# Patient Record
Sex: Male | Born: 1995 | Race: White | Hispanic: No | Marital: Single | Smoking: Former smoker
Health system: Southern US, Community
[De-identification: ages and names within clinical notes are randomized; demographics above are authoritative.]

---
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
## 2016-10-05 ENCOUNTER — Encounter: Payer: Self-pay | Admitting: Emergency Medicine

## 2016-10-05 ENCOUNTER — Ambulatory Visit
Admission: EM | Admit: 2016-10-05 | Discharge: 2016-10-05 | Disposition: A | Discharge status: Home / Self Care | Payer: 59 | Attending: Emergency Medicine | Admitting: Emergency Medicine

## 2016-10-05 ENCOUNTER — Ambulatory Visit (INDEPENDENT_AMBULATORY_CARE_PROVIDER_SITE_OTHER): Payer: 59

## 2016-10-05 DIAGNOSIS — Z87891 Personal history of nicotine dependence: Secondary | ICD-10-CM | POA: Diagnosis not present

## 2016-10-05 DIAGNOSIS — R079 Chest pain, unspecified: Secondary | ICD-10-CM | POA: Diagnosis present

## 2016-10-05 DIAGNOSIS — R0789 Other chest pain: Secondary | ICD-10-CM

## 2016-10-05 MED ORDER — NAPROXEN 500 MG PO TABS: 500.0000 mg | ORAL_TABLET | Freq: Two times a day (BID) | ORAL | 0 refills | Status: AC

## 2016-10-05 NOTE — Discharge Instructions (Signed)
Use ice over your area pain 20 minutes out of every 2 hours forward to 5 times daily. In 2 days when he switched to using heat alternating with ice. Cough and deep breathe every hour at least. Follow-up with primary care in 2 weeks if not improving.

## 2016-10-05 NOTE — ED Triage Notes (Signed)
Patient c/o chest pain that started on Monday.  Patient states that when he takes a dep breath that pain gets worse.  Patient reports that prior to his chest pain starting he was hit in the chest at a kickboxing class earlier on Monday.

## 2016-10-05 NOTE — ED Provider Notes (Signed)
CSN: [id number]     Arrival date & time 10/05/16  1309 History   First MD Initiated Contact with Patient 10/05/16 1405     Chief Complaint  Patient presents with  . Chest Pain   (Consider location/radiation/quality/duration/timing/severity/associated sxs/prior Treatment) HPI  This a 21 year old male who presents with left-sided chest pain that started on Monday. Patient states that Monday he was in kick boxing practice when he was hit in the area that is most painful which is right under the left nipple and just slightly lateral and anterior axillary line. He states the kick was not excessive. But he felt pain immediately in his chest. Since that time he has pain with any deep breathing or with motion of his left arm. She feels more comfortable when he sits slightly forward. When he is quiet at rest it does not hurt at all.      History reviewed. No pertinent past medical history. History reviewed. No pertinent surgical history. Family History  Problem Relation Age of Onset  . Healthy Mother   . Healthy Father    Social History  Substance Use Topics  . Smoking status: Former Games developermoker  . Smokeless tobacco: Never Used  . Alcohol use No    Review of Systems  Constitutional: Positive for activity change. Negative for appetite change, chills, fatigue and fever.  Respiratory: Positive for shortness of breath. Negative for cough.   Cardiovascular: Positive for chest pain.  All other systems reviewed and are negative.   Allergies  Patient has no known allergies.  Home Medications   Prior to Admission medications   Medication Sig Start Date End Date Taking? Authorizing Provider  naproxen (NAPROSYN) 500 MG tablet Take 1 tablet (500 mg total) by mouth 2 (two) times daily with a meal. 10/05/16   Lutricia Feiloemer, Baraa Tubbs P, PA-C   Meds Ordered and Administered this Visit  Medications - No data to display  BP 138/83 (BP Location: Left Arm)   Pulse 100   Temp 97.9 F (36.6 C) (Oral)   Resp  16   Ht 5\' 9"  (1.753 m)   Wt 160 lb (72.6 kg)   SpO2 100%   BMI 23.63 kg/m  No data found.   Physical Exam  Constitutional: He is oriented to person, place, and time. He appears well-developed and well-nourished. No distress.  HENT:  Head: Normocephalic.  Eyes: Pupils are equal, round, and reactive to light.  Neck: Normal range of motion.  Cardiovascular: Normal rate, regular rhythm and normal heart sounds.  Exam reveals no gallop and no friction rub.   No murmur heard. Pulmonary/Chest: Effort normal and breath sounds normal. No respiratory distress. He has no wheezes. He has no rales. He exhibits tenderness.  Examination of the left chest shows  severe chest wall tenderness over the sixth rib in the anterior axillary line. This is sharply localized in one spot. Compression on the sternum also causes some discomfort but not nearly as much as compression over the one area as above. He does tend to splint with a deep inspiration.  Musculoskeletal: Normal range of motion.  Neurological: He is alert and oriented to person, place, and time.  Skin: Skin is warm and dry. He is not diaphoretic.  Psychiatric: He has a normal mood and affect. His behavior is normal. Judgment and thought content normal.  Nursing note and vitals reviewed.   Urgent Care Course     Procedures (including critical care time)  Labs Review Labs Reviewed - No data to display  Imaging Review Dg Chest 2 View  Result Date: 10/05/2016 CLINICAL DATA:  Initial evaluation for acute chest pain status post recent trauma. Pain primarily on left. EXAM: CHEST  2 VIEW COMPARISON:  None. FINDINGS: The cardiac and mediastinal silhouettes are within normal limits. The lungs are normally inflated. No airspace consolidation, pleural effusion, or pulmonary edema is identified. There is no pneumothorax. No acute osseous abnormality identified. IMPRESSION: No active cardiopulmonary disease. Electronically Signed   By: Rise Mu M.D.   On: 10/05/2016 13:49     Visual Acuity Review  Right Eye Distance:   Left Eye Distance:   Bilateral Distance:    Right Eye Near:   Left Eye Near:    Bilateral Near:     ED ECG REPORT   Date: 10/05/2016  EKG Time: 2:42 PM  Rate106 Rhythm: sinus tachycardia,  normal EKG,  there are no previous tracings available for comparison  Axis: Normal  Intervals:none  ST&T Change: No acute changes  Narrative Interpretation: Sinus tachycardia without acute changes            MDM   1. Chest wall pain    Discharge Medication List as of 10/05/2016  2:37 PM    START taking these medications   Details  naproxen (NAPROSYN) 500 MG tablet Take 1 tablet (500 mg total) by mouth 2 (two) times daily with a meal., Starting Fri 10/05/2016, Print      Plan: 1. Test/x-ray results and diagnosis reviewed with patient 2. rx as per orders; risks, benefits, potential side effects reviewed with patient 3. Recommend supportive treatment with Cough and deep breathe hourly basis. Use Naprosyn for pain. Follow-up with primary care provider not improving. Emergency room if the pain increases sharply or at rest. 4. F/u prn if symptoms worsen or don't improve     Lutricia Feil, PA-C 10/05/16 1442

## 2018-03-02 IMAGING — CR DG CHEST 2V
2 series · 2 of 2 positions shown · non-contrast
Comparison: None.

CLINICAL DATA: Initial evaluation for acute chest pain status post
recent trauma. Pain primarily on left.

EXAM:
CHEST  2 VIEW

[chest pa]
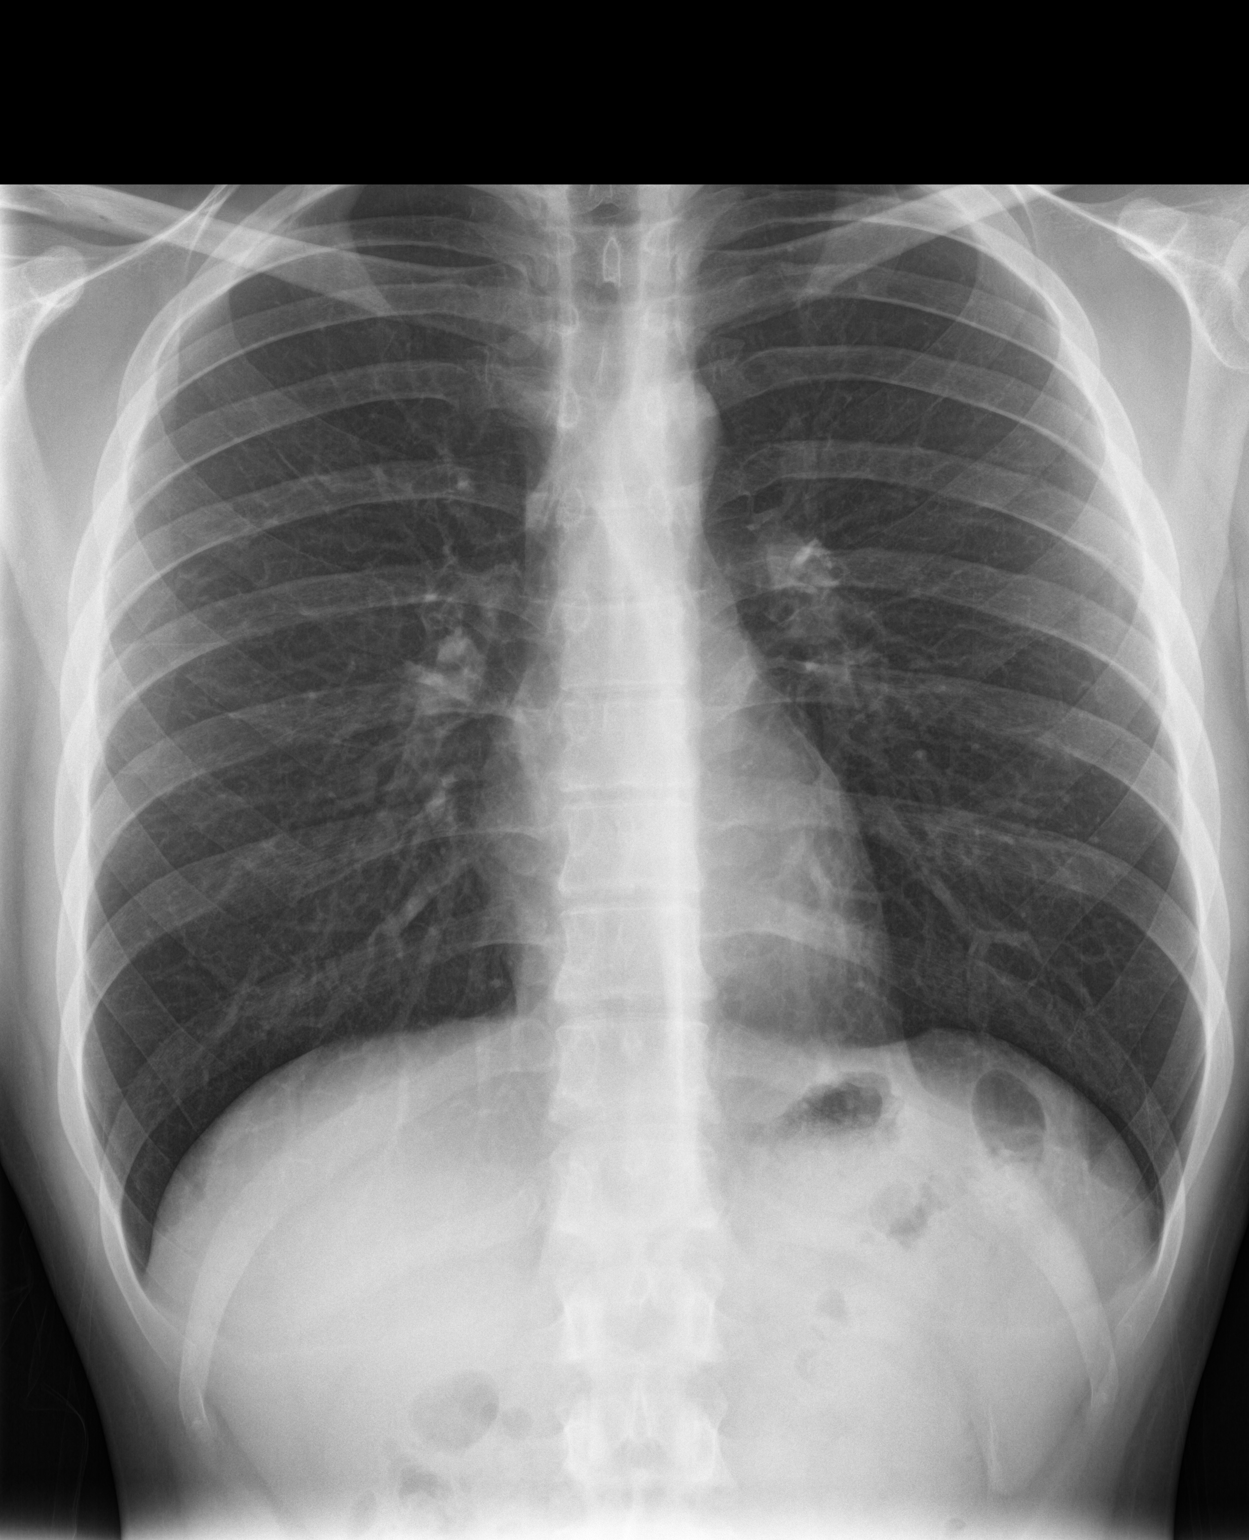

[chest lat]
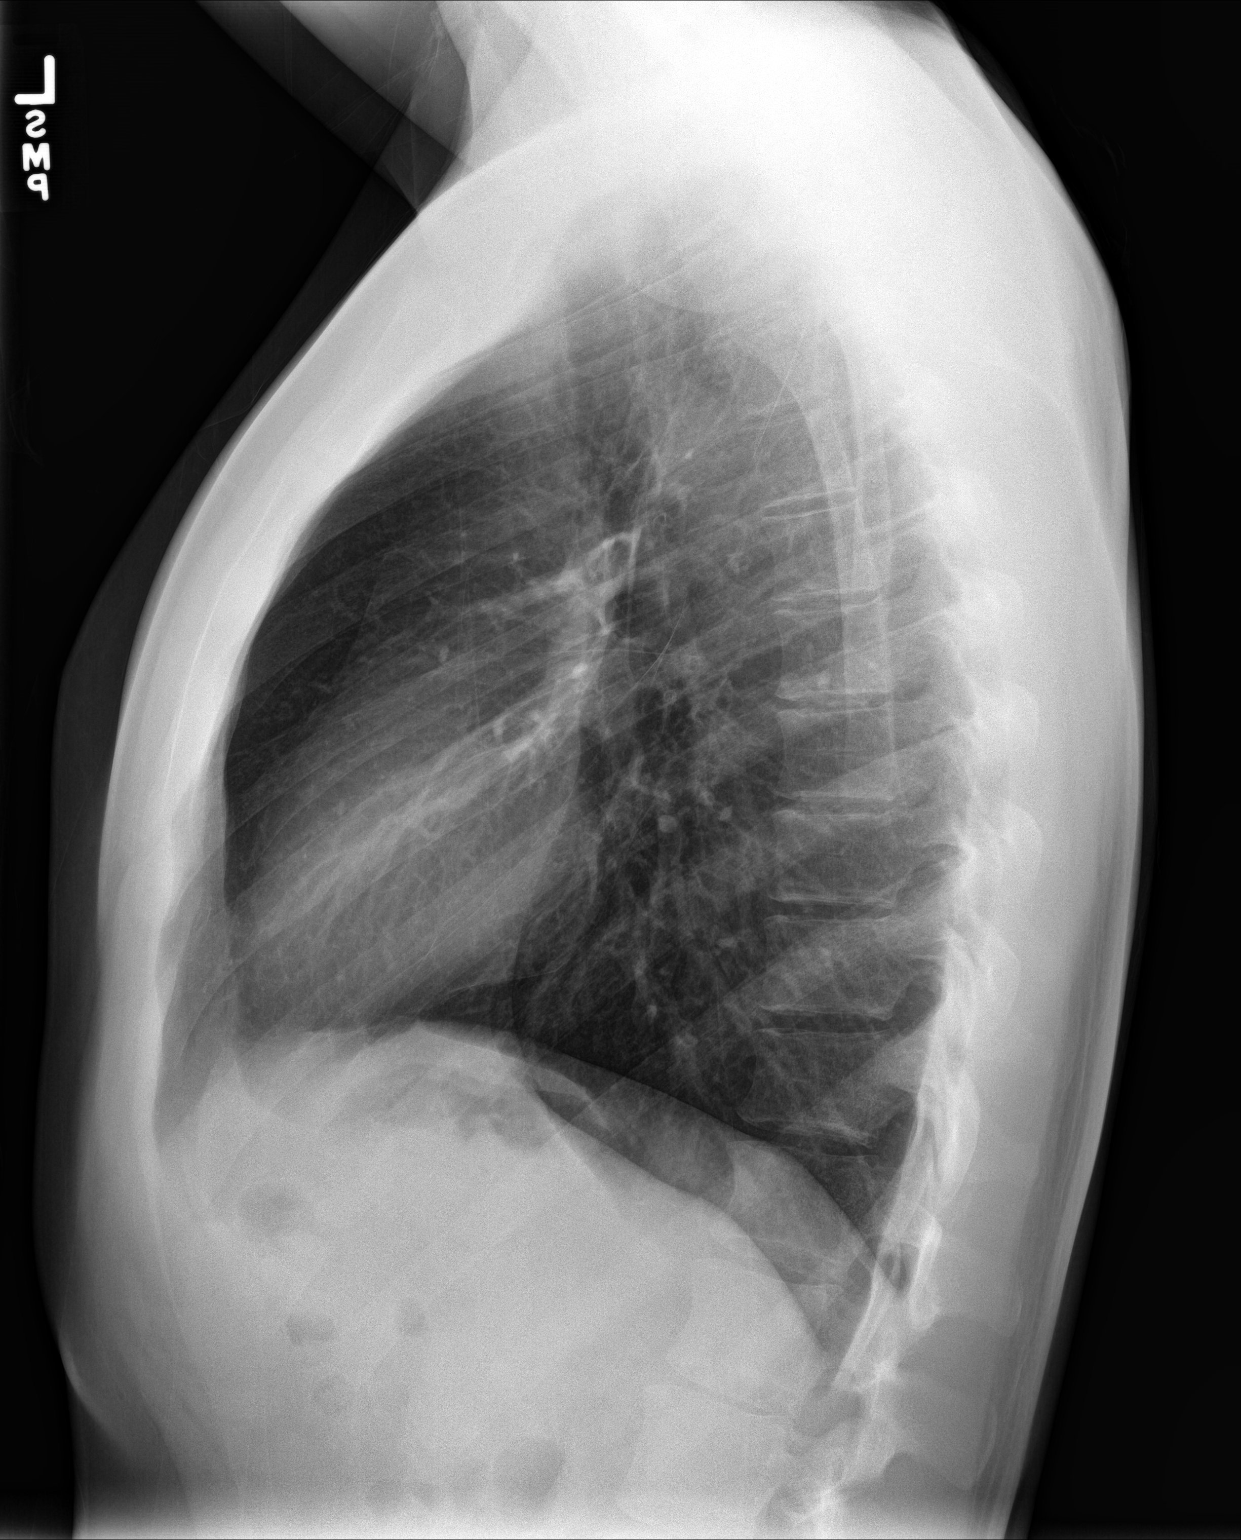

[2 of 2 positions shown; findings below may reference images not displayed]

FINDINGS: The cardiac and mediastinal silhouettes are within normal limits.

The lungs are normally inflated. No airspace consolidation, pleural
effusion, or pulmonary edema is identified. There is no
pneumothorax.

No acute osseous abnormality identified.
IMPRESSION: No active cardiopulmonary disease.

## 2019-01-22 DIAGNOSIS — H52223 Regular astigmatism, bilateral: Secondary | ICD-10-CM | POA: Diagnosis not present

## 2023-04-15 ENCOUNTER — Ambulatory Visit
Admission: EM | Admit: 2023-04-15 | Discharge: 2023-04-15 | Disposition: A | Discharge status: Home / Self Care | Payer: 59 | Attending: Nurse Practitioner | Admitting: Nurse Practitioner

## 2023-04-15 DIAGNOSIS — S91331A Puncture wound without foreign body, right foot, initial encounter: Secondary | ICD-10-CM | POA: Diagnosis not present

## 2023-04-15 MED ORDER — MUPIROCIN 2 % EX OINT: 1.0000 | TOPICAL_OINTMENT | Freq: Two times a day (BID) | CUTANEOUS | 0 refills | Status: AC

## 2023-04-15 NOTE — ED Triage Notes (Signed)
Pt reports he stepped on some rusty nails with his right foot  while at work today.   Last tetanus 4  years ago

## 2023-04-15 NOTE — ED Provider Notes (Signed)
[facility]    CSN: [id number] Arrival date & time: 04/15/23  1348      History   Chief Complaint No chief complaint on file.   HPI Austin Farmer is a 27 y.o. male.   Patient presents today for puncture wounds to right foot.  Reports he was working in his yard when he stepped on a wooden slab that had 3 rusty nails in it.  He felt the nails penetrate his skin, then pulled his foot off of the nails without difficulty.  Reports the nails did puncture through his tennis shoe.  This occurred about 1 hour ago.  No numbness or tingling in the toes, he endorses pain around one of the puncture wounds.  There is clear drainage from the puncture wounds.  Last Tdap 2020.  Patient denies history of type 2 diabetes or history of skin infections.    History reviewed. No pertinent past medical history.  There are no active problems to display for this patient.   History reviewed. No pertinent surgical history.     Home Medications    Prior to Admission medications   Medication Sig Start Date End Date Taking? Authorizing Provider  mupirocin ointment (BACTROBAN) 2 % Apply 1 Application topically 2 (two) times daily for 5 days. 04/15/23 04/20/23 Yes Valentino Nose, NP  naproxen (NAPROSYN) 500 MG tablet Take 1 tablet (500 mg total) by mouth 2 (two) times daily with a meal. 10/05/16   Lutricia Feil, PA-C    Family History Family History  Problem Relation Age of Onset   Healthy Mother    Healthy Father     Social History Social History   Tobacco Use   Smoking status: Former   Smokeless tobacco: Never  Vaping Use   Vaping status: Every Day  Substance Use Topics   Alcohol use: No   Drug use: No     Allergies   Patient has no known allergies.   Review of Systems Review of Systems Per HPI  Physical Exam Triage Vital Signs ED Triage Vitals  Encounter Vitals Group     BP 04/15/23 1440 (!) 137/96     Systolic BP Percentile --      Diastolic  BP Percentile --      Pulse Rate 04/15/23 1440 80     Resp 04/15/23 1440 20     Temp 04/15/23 1440 97.9 F (36.6 C)     Temp Source 04/15/23 1440 Oral     SpO2 04/15/23 1440 99 %     Weight --      Height --      Head Circumference --      Peak Flow --      Pain Score 04/15/23 1442 0     Pain Loc --      Pain Education --      Exclude from Growth Chart --    No data found.  Updated Vital Signs BP (!) 137/96 (BP Location: Right Arm)   Pulse 80   Temp 97.9 F (36.6 C) (Oral)   Resp 20   SpO2 99%   Visual Acuity Right Eye Distance:   Left Eye Distance:   Bilateral Distance:    Right Eye Near:   Left Eye Near:    Bilateral Near:     Physical Exam Vitals and nursing note reviewed.  Constitutional:      General: He is not in acute distress.    Appearance: Normal appearance. He is not  toxic-appearing.  HENT:     Head: Normocephalic and atraumatic.     Mouth/Throat:     Mouth: Mucous membranes are moist.  Pulmonary:     Effort: Pulmonary effort is normal. No respiratory distress.  Skin:    General: Skin is warm and dry.     Capillary Refill: Capillary refill takes less than 2 seconds.     Coloration: Skin is not jaundiced or pale.     Findings: No rash.     Comments: 2 puncture wounds noted to sole of right foot; there is serous drainage.  No surrounding erythema, warmth, tenderness to touch surrounding wounds.    Neurological:     Mental Status: He is alert and oriented to person, place, and time.  Psychiatric:        Behavior: Behavior is cooperative.      UC Treatments / Results  Labs (all labs ordered are listed, but only abnormal results are displayed) Labs Reviewed - No data to display  EKG   Radiology No results found.  Procedures Procedures (including critical care time)  Medications Ordered in UC Medications - No data to display  Initial Impression / Assessment and Plan / UC Course  I have reviewed the triage vital signs and the nursing  notes.  Pertinent labs & imaging results that were available during my care of the patient were reviewed by me and considered in my medical decision making (see chart for details).   Patient is well-appearing, normotensive, afebrile, not tachycardic, not tachypneic, oxygenating well on room air.    1. Puncture wound of right foot, initial encounter No red flags Tdap is up-to-date Wound care discussed and supportive care discussed Antibiotics deferred; discussed signs or symptoms of infection and when to return to clinic  The patient was given the opportunity to ask questions.  All questions answered to their satisfaction.  The patient is in agreement to this plan.    Final Clinical Impressions(s) / UC Diagnoses   Final diagnoses:  Puncture wound of right foot, initial encounter     Discharge Instructions      Keep the puncture wound clean and dry.  Clean twice daily with mild soap and water, then apply a little bit of mupirocin ointment and dry gauze.  Keep covered while draining, then can leave open to air.  If you develop redness, swelling, significant pain, or thick discolored drainage from the wounds, please return to be reevaluated.  Your last tetanus shot was 4 years ago, we do not need to update tetanus shot today.    ED Prescriptions     Medication Sig Dispense Auth. Provider   mupirocin ointment (BACTROBAN) 2 % Apply 1 Application topically 2 (two) times daily for 5 days. 15 g Valentino Nose, NP      PDMP not reviewed this encounter.   Valentino Nose, NP 04/15/23 [phone]

## 2023-04-15 NOTE — Discharge Instructions (Signed)
Keep the puncture wound clean and dry.  Clean twice daily with mild soap and water, then apply a little bit of mupirocin ointment and dry gauze.  Keep covered while draining, then can leave open to air.  If you develop redness, swelling, significant pain, or thick discolored drainage from the wounds, please return to be reevaluated.  Your last tetanus shot was 4 years ago, we do not need to update tetanus shot today.
